# Patient Record
Sex: Female | Born: 1960 | Race: White | Hispanic: No | Marital: Single | Smoking: Current every day smoker
Health system: Southern US, Community
[De-identification: ages and names within clinical notes are randomized; demographics above are authoritative.]

## PROBLEM LIST (undated history)

## (undated) DIAGNOSIS — M1990 Unspecified osteoarthritis, unspecified site: Secondary | ICD-10-CM

## (undated) DIAGNOSIS — A6920 Lyme disease, unspecified: Secondary | ICD-10-CM

## (undated) DIAGNOSIS — E119 Type 2 diabetes mellitus without complications: Secondary | ICD-10-CM

## (undated) DIAGNOSIS — F32A Depression, unspecified: Secondary | ICD-10-CM

## (undated) DIAGNOSIS — F329 Major depressive disorder, single episode, unspecified: Secondary | ICD-10-CM

## (undated) DIAGNOSIS — I10 Essential (primary) hypertension: Secondary | ICD-10-CM

## (undated) DIAGNOSIS — F419 Anxiety disorder, unspecified: Secondary | ICD-10-CM

## (undated) HISTORY — PX: CHOLECYSTECTOMY: SHX55

## (undated) HISTORY — PX: ARTHROSCOPIC REPAIR ACL: SUR80

---

## 2016-01-12 ENCOUNTER — Emergency Department (HOSPITAL_BASED_OUTPATIENT_CLINIC_OR_DEPARTMENT_OTHER): Payer: Medicaid Other

## 2016-01-12 ENCOUNTER — Emergency Department (HOSPITAL_BASED_OUTPATIENT_CLINIC_OR_DEPARTMENT_OTHER)
Admission: EM | Admit: 2016-01-12 | Discharge: 2016-01-13 | Disposition: A | Discharge status: Home / Self Care | Payer: Medicaid Other | Attending: Emergency Medicine | Admitting: Emergency Medicine

## 2016-01-12 ENCOUNTER — Encounter (HOSPITAL_BASED_OUTPATIENT_CLINIC_OR_DEPARTMENT_OTHER): Payer: Self-pay | Admitting: Emergency Medicine

## 2016-01-12 DIAGNOSIS — F17200 Nicotine dependence, unspecified, uncomplicated: Secondary | ICD-10-CM | POA: Insufficient documentation

## 2016-01-12 DIAGNOSIS — Y939 Activity, unspecified: Secondary | ICD-10-CM | POA: Diagnosis not present

## 2016-01-12 DIAGNOSIS — X58XXXA Exposure to other specified factors, initial encounter: Secondary | ICD-10-CM | POA: Diagnosis not present

## 2016-01-12 DIAGNOSIS — Z8659 Personal history of other mental and behavioral disorders: Secondary | ICD-10-CM | POA: Insufficient documentation

## 2016-01-12 DIAGNOSIS — S90121A Contusion of right lesser toe(s) without damage to nail, initial encounter: Secondary | ICD-10-CM | POA: Diagnosis not present

## 2016-01-12 DIAGNOSIS — I10 Essential (primary) hypertension: Secondary | ICD-10-CM | POA: Insufficient documentation

## 2016-01-12 DIAGNOSIS — Y998 Other external cause status: Secondary | ICD-10-CM | POA: Insufficient documentation

## 2016-01-12 DIAGNOSIS — M549 Dorsalgia, unspecified: Secondary | ICD-10-CM | POA: Diagnosis not present

## 2016-01-12 DIAGNOSIS — G8929 Other chronic pain: Secondary | ICD-10-CM | POA: Insufficient documentation

## 2016-01-12 DIAGNOSIS — S99921A Unspecified injury of right foot, initial encounter: Secondary | ICD-10-CM | POA: Diagnosis present

## 2016-01-12 DIAGNOSIS — Y929 Unspecified place or not applicable: Secondary | ICD-10-CM | POA: Diagnosis not present

## 2016-01-12 HISTORY — DX: Anxiety disorder, unspecified: F41.9

## 2016-01-12 HISTORY — DX: Major depressive disorder, single episode, unspecified: F32.9

## 2016-01-12 HISTORY — DX: Essential (primary) hypertension: I10

## 2016-01-12 HISTORY — DX: Depression, unspecified: F32.A

## 2016-01-12 NOTE — ED Notes (Signed)
Pt in c/o toe pain on R foot, denies injury. Pt has chronic pain.

## 2016-01-12 NOTE — ED Notes (Signed)
Discoloration, bruising and pain to middle right toe with unknown injury.

## 2016-01-13 NOTE — Discharge Instructions (Signed)

## 2016-01-13 NOTE — ED Provider Notes (Addendum)
CSN: [id number]     Arrival date & time 01/12/16  2237 History   First MD Initiated Contact with Patient 02/02/2016 0010     Chief Complaint  Patient presents with  . Toe Pain     (Consider location/radiation/quality/duration/timing/severity/associated sxs/prior Treatment) HPI  This is a 55 year old female who noticed yesterday evening that her right middle toe was purple. She does not recall injuring it. There is no associated deformity. She states she has chronic back pain pain and has been on oxycodone and fentanyl, multiple prescriptions in the last year. She is also on diazepam. She is having some pain in that toe with ambulation but not with palpation or movement.  Past Medical History  Diagnosis Date  . Hypertension   . Depression   . Anxiety    Past Surgical History  Procedure Laterality Date  . Arthroscopic repair acl    . Cholecystectomy     History reviewed. No pertinent family history. Social History  Substance Use Topics  . Smoking status: Current Every Day Smoker  . Smokeless tobacco: None  . Alcohol Use: No   OB History    No data available     Review of Systems  All other systems reviewed and are negative.   Allergies  Review of patient's allergies indicates no known allergies.  Home Medications   Prior to Admission medications   Not on File but on diazepam and Percocet per [place] Controlled Substance Reporting System.   BP 135/73 mmHg  Pulse 97  Temp(Src) 98.6 F (37 C) (Oral)  Resp 18  Ht  (1.676 m)  Wt 160 lb (72.576 kg)  BMI 25.84 kg/m2  SpO2 95%   Physical Exam  General: Well-developed, well-nourished female in no acute distress; appearance consistent with age of record HENT: normocephalic; atraumatic Eyes: pupils equal, round and reactive to light; extraocular muscles intact Neck: supple Heart: regular rate and rhythm Lungs: clear to auscultation bilaterally Abdomen: soft; nondistended; nontender; bowel sounds present Extremities:  No deformity; full range of motion; pulses normal; subacute appearing ecchymosis of the right middle toe without tenderness on passive range of motion, sensation intact distally with brisk capillary refill:  Neurologic: Awake, alert; dysarthria; motor function intact in all extremities and symmetric; no facial droop Skin: Warm and dry Psychiatric: Normal mood and affect    ED Course  Procedures (including critical care time)   MDM  Nursing notes and vitals signs, including pulse oximetry, reviewed.  Summary of this visit's results, reviewed by myself:  Imaging Studies: Dg Foot Complete Right  2016-02-02  CLINICAL DATA:  Right foot third toe pain and bruising. No known injury. EXAM: RIGHT FOOT COMPLETE - 3+ VIEW COMPARISON:  None. FINDINGS: There is no evidence of fracture or dislocation, with special attention to the third toe. There is no evidence of arthropathy. Plantar calcaneal spur is noted. There is an os peroneal. Soft tissues are unremarkable. IMPRESSION: No acute bony abnormality. Plantar calcaneal spur. Electronically Signed   By: Rubye Oaks M.D.   On: 2016-02-02 00:06   On examination the patient's toe appears to have a subacute injury. The ecchymosis appears several days old. There is no associated fracture. We will provide a postop shoe. Narcotic analgesics are not indicated especially in view of the patient's history of chronic opioid and diazepam use.     Paula Libra, MD 02/02/16 4034  Paula Libra, MD 02-02-16 [id number]

## 2016-01-13 NOTE — ED Notes (Signed)
Pt verbalizes understanding of d/c instructions and denies any further needs at this time. 

## 2016-02-11 ENCOUNTER — Encounter (HOSPITAL_BASED_OUTPATIENT_CLINIC_OR_DEPARTMENT_OTHER): Payer: Self-pay | Admitting: *Deleted

## 2016-02-11 ENCOUNTER — Emergency Department (HOSPITAL_BASED_OUTPATIENT_CLINIC_OR_DEPARTMENT_OTHER): Payer: Medicaid Other

## 2016-02-11 ENCOUNTER — Emergency Department (HOSPITAL_BASED_OUTPATIENT_CLINIC_OR_DEPARTMENT_OTHER)
Admission: EM | Admit: 2016-02-11 | Discharge: 2016-02-11 | Disposition: A | Discharge status: Home / Self Care | Payer: Medicaid Other | Attending: Emergency Medicine | Admitting: Emergency Medicine

## 2016-02-11 DIAGNOSIS — F17210 Nicotine dependence, cigarettes, uncomplicated: Secondary | ICD-10-CM | POA: Diagnosis not present

## 2016-02-11 DIAGNOSIS — Y9289 Other specified places as the place of occurrence of the external cause: Secondary | ICD-10-CM | POA: Insufficient documentation

## 2016-02-11 DIAGNOSIS — S8991XA Unspecified injury of right lower leg, initial encounter: Secondary | ICD-10-CM | POA: Diagnosis present

## 2016-02-11 DIAGNOSIS — E119 Type 2 diabetes mellitus without complications: Secondary | ICD-10-CM | POA: Diagnosis not present

## 2016-02-11 DIAGNOSIS — I10 Essential (primary) hypertension: Secondary | ICD-10-CM | POA: Diagnosis not present

## 2016-02-11 DIAGNOSIS — Y9389 Activity, other specified: Secondary | ICD-10-CM | POA: Insufficient documentation

## 2016-02-11 DIAGNOSIS — Y998 Other external cause status: Secondary | ICD-10-CM | POA: Insufficient documentation

## 2016-02-11 DIAGNOSIS — F419 Anxiety disorder, unspecified: Secondary | ICD-10-CM | POA: Insufficient documentation

## 2016-02-11 DIAGNOSIS — G8929 Other chronic pain: Secondary | ICD-10-CM | POA: Insufficient documentation

## 2016-02-11 DIAGNOSIS — Z8619 Personal history of other infectious and parasitic diseases: Secondary | ICD-10-CM | POA: Insufficient documentation

## 2016-02-11 DIAGNOSIS — F329 Major depressive disorder, single episode, unspecified: Secondary | ICD-10-CM | POA: Diagnosis not present

## 2016-02-11 DIAGNOSIS — M1990 Unspecified osteoarthritis, unspecified site: Secondary | ICD-10-CM | POA: Diagnosis not present

## 2016-02-11 DIAGNOSIS — X58XXXA Exposure to other specified factors, initial encounter: Secondary | ICD-10-CM | POA: Diagnosis not present

## 2016-02-11 DIAGNOSIS — S81801A Unspecified open wound, right lower leg, initial encounter: Secondary | ICD-10-CM | POA: Insufficient documentation

## 2016-02-11 DIAGNOSIS — Z7984 Long term (current) use of oral hypoglycemic drugs: Secondary | ICD-10-CM | POA: Diagnosis not present

## 2016-02-11 DIAGNOSIS — M25561 Pain in right knee: Secondary | ICD-10-CM

## 2016-02-11 HISTORY — DX: Unspecified osteoarthritis, unspecified site: M19.90

## 2016-02-11 HISTORY — DX: Type 2 diabetes mellitus without complications: E11.9

## 2016-02-11 HISTORY — DX: Lyme disease, unspecified: A69.20

## 2016-02-11 LAB — BASIC METABOLIC PANEL
Anion gap: 12 (ref 5–15)
BUN: 28 mg/dL — AB (ref 6–20)
CHLORIDE: 104 mmol/L (ref 101–111)
CO2: 22 mmol/L (ref 22–32)
CREATININE: 0.79 mg/dL (ref 0.44–1.00)
Calcium: 9.1 mg/dL (ref 8.9–10.3)
Glucose, Bld: 80 mg/dL (ref 65–99)
POTASSIUM: 3.2 mmol/L — AB (ref 3.5–5.1)
SODIUM: 138 mmol/L (ref 135–145)

## 2016-02-11 LAB — CBC WITH DIFFERENTIAL/PLATELET
BASOS ABS: 0.1 10*3/uL (ref 0.0–0.1)
Basophils Relative: 1 %
EOS ABS: 0.4 10*3/uL (ref 0.0–0.7)
EOS PCT: 4 %
HCT: 38.9 % (ref 36.0–46.0)
HEMOGLOBIN: 13.5 g/dL (ref 12.0–15.0)
LYMPHS PCT: 23 %
Lymphs Abs: 2.5 10*3/uL (ref 0.7–4.0)
MCH: 33.9 pg (ref 26.0–34.0)
MCHC: 34.7 g/dL (ref 30.0–36.0)
MCV: 97.7 fL (ref 78.0–100.0)
Monocytes Absolute: 0.8 10*3/uL (ref 0.1–1.0)
Monocytes Relative: 7 %
NEUTROS PCT: 65 %
Neutro Abs: 7.1 10*3/uL (ref 1.7–7.7)
PLATELETS: 252 10*3/uL (ref 150–400)
RBC: 3.98 MIL/uL (ref 3.87–5.11)
RDW: 13.8 % (ref 11.5–15.5)
WBC: 10.9 10*3/uL — AB (ref 4.0–10.5)

## 2016-02-11 MED ORDER — HYDROCODONE-ACETAMINOPHEN 5-325 MG PO TABS: 1.0000 | ORAL_TABLET | Freq: Four times a day (QID) | ORAL | Status: AC | PRN

## 2016-02-11 MED ORDER — NAPROXEN 500 MG PO TABS: 500.0000 mg | ORAL_TABLET | Freq: Two times a day (BID) | ORAL | Status: AC

## 2016-02-11 NOTE — ED Notes (Signed)
Pt placed on auto vitals Q30.  

## 2016-02-11 NOTE — ED Notes (Signed)
MD at bedside. 

## 2016-02-11 NOTE — ED Notes (Signed)
Reports she was seen in ED for right toe pain recently. Pain in now in leg and knee. Denies new injury. Hx of RA

## 2016-02-11 NOTE — Discharge Instructions (Signed)
°  Knee immobilizer for comfort. Take Naprosyn on a regular basis. Supplement with hydrocodone as needed for additional pain relief. X-rays were negative other than showing some evidence of arthritic changes in the right knee. No evidence of any blood clots in the leg.   Follow-up with sports medicine referral information provided.

## 2016-02-11 NOTE — ED Provider Notes (Signed)
CSN: [id number]     Arrival date & time 02/11/16  1704 History  By signing my name below, I, Gonzella Lex, attest that this documentation has been prepared under the direction and in the presence of Vanetta Mulders, MD. Electronically Signed: Gonzella Lex, Scribe. 02/11/2016. 6:56 PM.   Chief Complaint  Patient presents with  . Knee Pain   Patient is a 56 y.o. female presenting with knee pain. The history is provided by the patient. No language interpreter was used.  Knee Pain Location:  Knee and leg Time since incident:  1 day Injury: no   Leg location:  R leg Knee location:  R knee Pain details:    Quality:  Unable to specify   Radiates to:  R leg   Severity:  Mild   Onset quality:  Sudden   Duration:  1 day   Timing:  Constant   Progression:  Unchanged Chronicity:  New Dislocation: no   Foreign body present:  No foreign bodies Prior injury to area:  No Relieved by:  Nothing Worsened by:  Nothing tried Ineffective treatments:  None tried Associated symptoms: back pain, numbness and swelling   Associated symptoms: no fever   HPI Comments: Megan Day is a 55 y.o. female with a hx of arthritis, chronic back pain, DM, and lyme disease and a surgical hx of arthroscopic ACL repair, who presents to the Emergency Department complaining of sudden onset, constant, 10/10 pain to her right lower extremity, right knee, right shin, and right toe which began earlier today. She also reports associated tightness along her right shin as well as pain with ambulation and weight bearing secondary to right knee and right leg pain. She also reports mild leg swelling as well as mild numbness to her RLE. Pt was also recently seen in the ED, about one month ago, for right toe pain. She denies any recent injury or fall and has not taken any pain medication today. Pt denies fever, chills, rhinorrhea, sore throat, visual disturbance, HA, cough, SOB, CP, nausea, vomiting, abd pain, diarrhea,  dysuria, hematuria, and use of anticoagulants.   Past Medical History  Diagnosis Date  . Hypertension   . Depression   . Anxiety   . Arthritis   . Lyme disease   . Diabetes mellitus without complication [facility])    Past Surgical History  Procedure Laterality Date  . Arthroscopic repair acl    . Cholecystectomy     No family history on file. Social History  Substance Use Topics  . Smoking status: Current Every Day Smoker    Types: Cigarettes  . Smokeless tobacco: Never Used  . Alcohol Use: No   OB History    No data available     Review of Systems  Constitutional: Negative for fever and chills.  HENT: Negative for rhinorrhea and sore throat.   Eyes: Negative for visual disturbance.  Respiratory: Negative for cough and shortness of breath.   Cardiovascular: Positive for leg swelling. Negative for chest pain.  Gastrointestinal: Negative for nausea, vomiting, abdominal pain and diarrhea.  Genitourinary: Negative for dysuria and hematuria.  Musculoskeletal: Positive for myalgias, back pain and arthralgias.  Skin: Positive for rash ( back of right leg).  Neurological: Positive for numbness.  Hematological: Bruises/bleeds easily.  Psychiatric/Behavioral: Negative for confusion.  All other systems reviewed and are negative.  Allergies  Review of patient's allergies indicates no known allergies.  Home Medications   Prior to Admission medications   Medication Sig Start Date  End Date Taking? Authorizing Provider  Amitriptyline HCl (ELAVIL PO) Take by mouth.   Yes Historical Provider, MD  diazepam (VALIUM) 10 MG tablet Take 10 mg by mouth every 12 (twelve) hours as needed for anxiety.   Yes Historical Provider, MD  GLIPIZIDE PO Take by mouth.   Yes Historical Provider, MD  METFORMIN HCL PO Take by mouth.   Yes Historical Provider, MD  oxyCODONE-acetaminophen (PERCOCET) 10-325 MG tablet Take 1 tablet by mouth every 6 (six) hours as needed for pain.   Yes Historical Provider, MD   PARoxetine HCl (PAXIL PO) Take by mouth.   Yes Historical Provider, MD  HYDROcodone-acetaminophen (NORCO/VICODIN) 5-325 MG tablet Take 1-2 tablets by mouth every 6 (six) hours as needed for moderate pain. 02/11/16   Vanetta Mulders, MD  naproxen (NAPROSYN) 500 MG tablet Take 1 tablet (500 mg total) by mouth 2 (two) times daily. 02/11/16   Vanetta Mulders, MD   BP 129/68 mmHg  Pulse 92  Temp(Src) 98.8 F (37.1 C) (Oral)  Resp 18  Ht  (1.676 m)  Wt 72.576 kg  BMI 25.84 kg/m2  SpO2 97% Physical Exam  Constitutional: She is oriented to person, place, and time. She appears well-developed and well-nourished. No distress.  HENT:  Head: Normocephalic.  Mouth/Throat: Oropharynx is clear and moist.  Eyes: Conjunctivae are normal. Pupils are equal, round, and reactive to light. No scleral icterus.  Cardiovascular: Normal rate, regular rhythm and normal heart sounds.   Pulmonary/Chest: Effort normal and breath sounds normal.  Lungs clear bilaterally  Abdominal: Soft. Bowel sounds are normal. She exhibits no distension. There is no tenderness.  Musculoskeletal: She exhibits no edema.  No swelling in the ankles Cap refill in both big toes is 1 second Dorsalis pedis pulse 1+ bilaterally  Increased warmth in the right knee with slight edema  No real effusion  Right calf is tight Back of heel of right leg areas where she scratched has some open wounds but no secondary infection Faint redness to third toe of right foot  Neurological: She is alert and oriented to person, place, and time. No cranial nerve deficit. She exhibits normal muscle tone. Coordination normal.  Skin: Skin is warm and dry.  Psychiatric: She has a normal mood and affect.  Nursing note and vitals reviewed.   ED Course  Procedures  DIAGNOSTIC STUDIES:    Oxygen Saturation is 99% on RA, normal by my interpretation.   COORDINATION OF CARE:  6:18 PM Will order x-rays of knee and leg. Will order lab work and ultra sound  study. Discussed treatment plan with pt at bedside and pt agreed to plan.   Results for orders placed or performed during the hospital encounter of 02/11/16  Basic metabolic panel  Result Value Ref Range   Sodium 138 135 - 145 mmol/L   Potassium 3.2 (L) 3.5 - 5.1 mmol/L   Chloride 104 101 - 111 mmol/L   CO2 22 22 - 32 mmol/L   Glucose, Bld 80 65 - 99 mg/dL   BUN 28 (H) 6 - 20 mg/dL   Creatinine, Ser 1.61 0.44 - 1.00 mg/dL   Calcium 9.1 8.9 - 09.6 mg/dL   GFR calc non Af Amer >60 >60 mL/min   GFR calc Af Amer >60 >60 mL/min   Anion gap 12 5 - 15  CBC with Differential/Platelet  Result Value Ref Range   WBC 10.9 (H) 4.0 - 10.5 K/uL   RBC 3.98 3.87 - 5.11 MIL/uL   Hemoglobin  13.5 12.0 - 15.0 g/dL   HCT 54.0 98.1 - 19.1 %   MCV 97.7 78.0 - 100.0 fL   MCH 33.9 26.0 - 34.0 pg   MCHC 34.7 30.0 - 36.0 g/dL   RDW 47.8 29.5 - 62.1 %   Platelets 252 150 - 400 K/uL   Neutrophils Relative % 65 %   Neutro Abs 7.1 1.7 - 7.7 K/uL   Lymphocytes Relative 23 %   Lymphs Abs 2.5 0.7 - 4.0 K/uL   Monocytes Relative 7 %   Monocytes Absolute 0.8 0.1 - 1.0 K/uL   Eosinophils Relative 4 %   Eosinophils Absolute 0.4 0.0 - 0.7 K/uL   Basophils Relative 1 %   Basophils Absolute 0.1 0.0 - 0.1 K/uL   Dg Tibia/fibula Right  02/11/2016  CLINICAL DATA:  Sudden onset of right lower extremity pain, initial encounter, no known injury EXAM: RIGHT TIBIA AND FIBULA - 2 VIEW COMPARISON:  None. FINDINGS: Degenerative changes of the right knee joint are noted. No acute fracture or dislocation is seen. Small joint effusion is noted in the knee joint. No other soft tissue abnormality is seen. IMPRESSION: Degenerative change with small joint effusion in the knee joint. Electronically Signed   By: Alcide Clever M.D.   On: 02/11/2016 20:19   US Venous Img Lower Unilateral Right  02/11/2016  CLINICAL DATA:  Sudden onset right lower extremity pain EXAM: Right LOWER EXTREMITY VENOUS DOPPLER ULTRASOUND TECHNIQUE: Gray-scale  sonography with graded compression, as well as color Doppler and duplex ultrasound were performed to evaluate the lower extremity deep venous systems from the level of the common femoral vein and including the common femoral, femoral, profunda femoral, popliteal and calf veins including the posterior tibial, peroneal and gastrocnemius veins when visible. The superficial great saphenous vein was also interrogated. Spectral Doppler was utilized to evaluate flow at rest and with distal augmentation maneuvers in the common femoral, femoral and popliteal veins. COMPARISON:  None. FINDINGS: Contralateral Common Femoral Vein: Respiratory phasicity is normal and symmetric with the symptomatic side. No evidence of thrombus. Normal compressibility. Common Femoral Vein: No evidence of thrombus. Normal compressibility, respiratory phasicity and response to augmentation. Saphenofemoral Junction: No evidence of thrombus. Normal compressibility and flow on color Doppler imaging. Profunda Femoral Vein: No evidence of thrombus. Normal compressibility and flow on color Doppler imaging. Femoral Vein: No evidence of thrombus. Normal compressibility, respiratory phasicity and response to augmentation. Popliteal Vein: No evidence of thrombus. Normal compressibility, respiratory phasicity and response to augmentation. Calf Veins: No evidence of thrombus. Normal compressibility and flow on color Doppler imaging. Superficial Great Saphenous Vein: No evidence of thrombus. Normal compressibility and flow on color Doppler imaging. Venous Reflux:  None. Other Findings:  Subcutaneous edema is noted about the ankle. IMPRESSION: No evidence of deep venous thrombosis. Electronically Signed   By: Alcide Clever M.D.   On: 02/11/2016 20:21   Dg Knee Complete 4 Views Right  02/11/2016  CLINICAL DATA:  Sudden onset of right lower extremity pain starting 1 week ago, pain and swelling EXAM: RIGHT KNEE - COMPLETE 4+ VIEW COMPARISON:  None. FINDINGS: Four  views of the right knee submitted. No acute fracture or subluxation. There is narrowing of medial joint compartment. Spurring of femoral condyles. Spurring of medial and lateral tibial plateau. Narrowing of patellofemoral joint space. Spurring of patella. Moderate joint effusion. IMPRESSION: No acute fracture or subluxation. Osteoarthritic changes as described above. Electronically Signed   By: Natasha Mead M.D.   On: 02/11/2016 20:20  Dg Foot Complete Right  01/13/2016  CLINICAL DATA:  Right foot third toe pain and bruising. No known injury. EXAM: RIGHT FOOT COMPLETE - 3+ VIEW COMPARISON:  None. FINDINGS: There is no evidence of fracture or dislocation, with special attention to the third toe. There is no evidence of arthropathy. Plantar calcaneal spur is noted. There is an os peroneal. Soft tissues are unremarkable. IMPRESSION: No acute bony abnormality. Plantar calcaneal spur. Electronically Signed   By: Rubye Oaks M.D.   On: 01/13/2016 00:06    Medications - No data to display  I have personally reviewed and evaluated these images and lab results as part of my medical decision-making.  MDM   Final diagnoses:  Knee pain, acute, right    Doppler studies of the right leg without evidence of a deep vein thrombosis. Evidence of arthritic changes in the right knee with a little bit of an effusion. Most likely this is due to arthritic changes and that's the cause of the pain. Will treat symptomatically with knee immobilizer and referred to sports medicine for follow-up. No concern for any vascular compromise to the right leg.  I personally performed the services described in this documentation, which was scribed in my presence. The recorded information has been reviewed and is accurate.      Vanetta Mulders, MD 02/11/16 2135

## 2017-03-04 IMAGING — DX DG TIBIA/FIBULA 2V*R*
4 series · 4 of 4 positions shown · non-contrast
Comparison: None.

CLINICAL DATA: Sudden onset of right lower extremity pain, initial
encounter, no known injury

EXAM:
RIGHT TIBIA AND FIBULA - 2 VIEW

[tibia ap (1 of 2)]
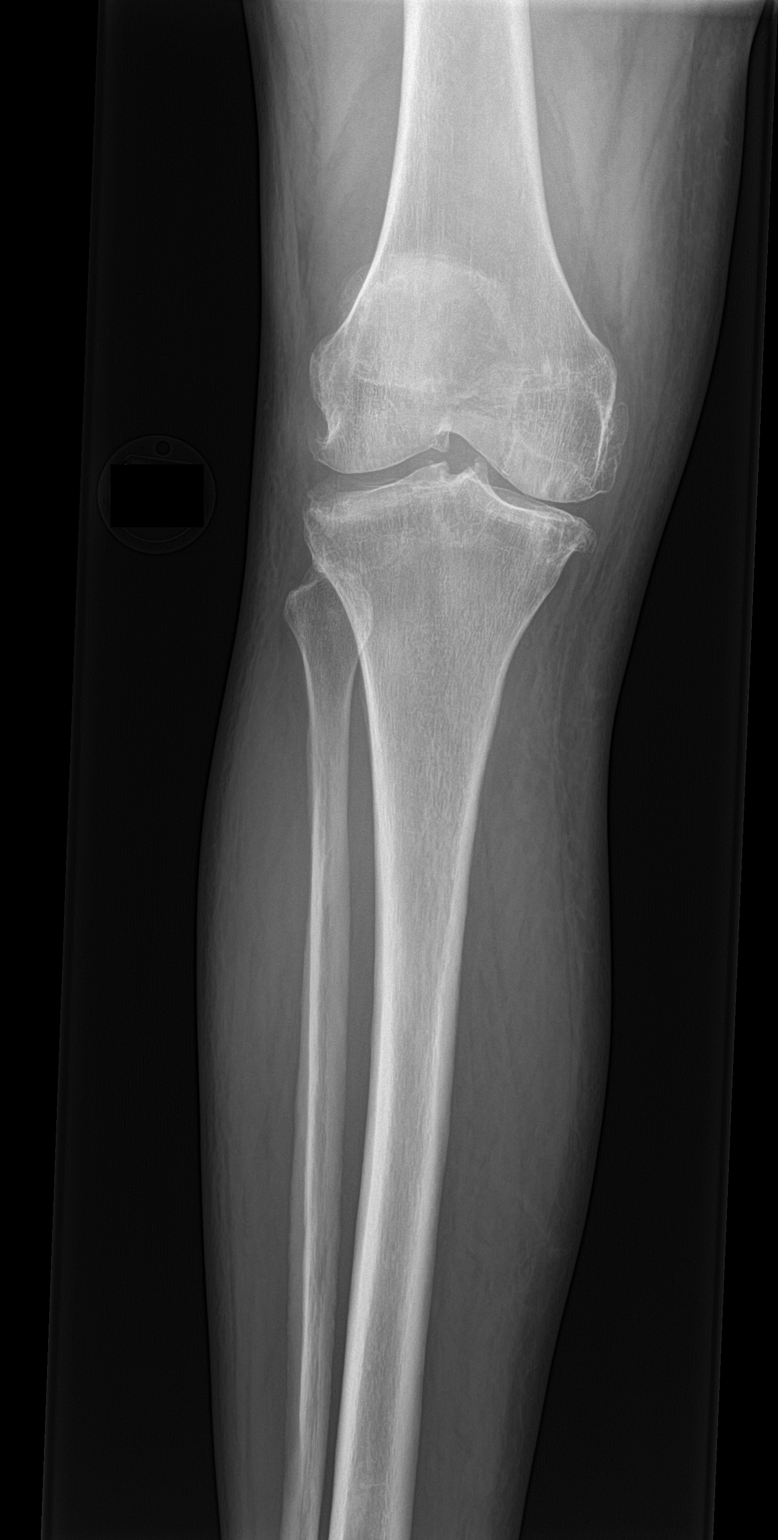

[tibia ap (2 of 2)]
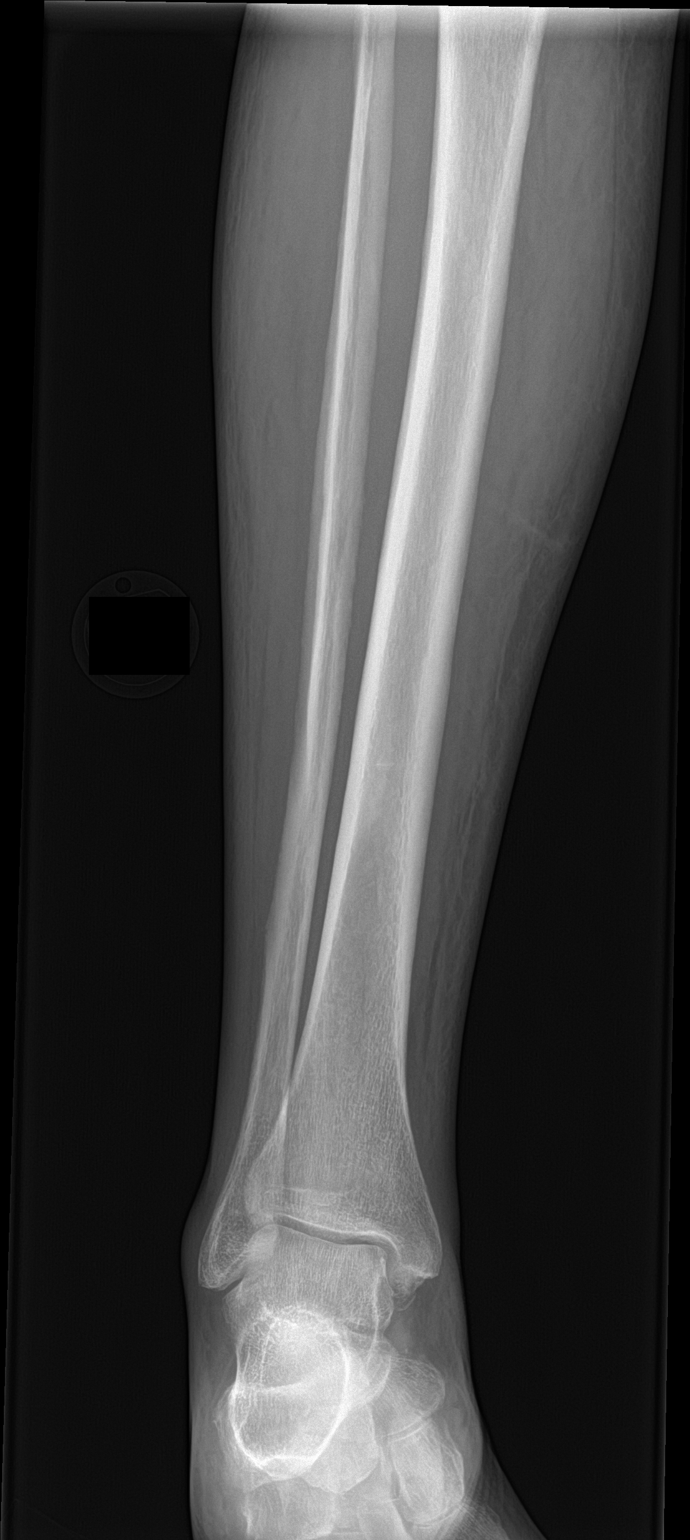

[tibia lat (1 of 2)]
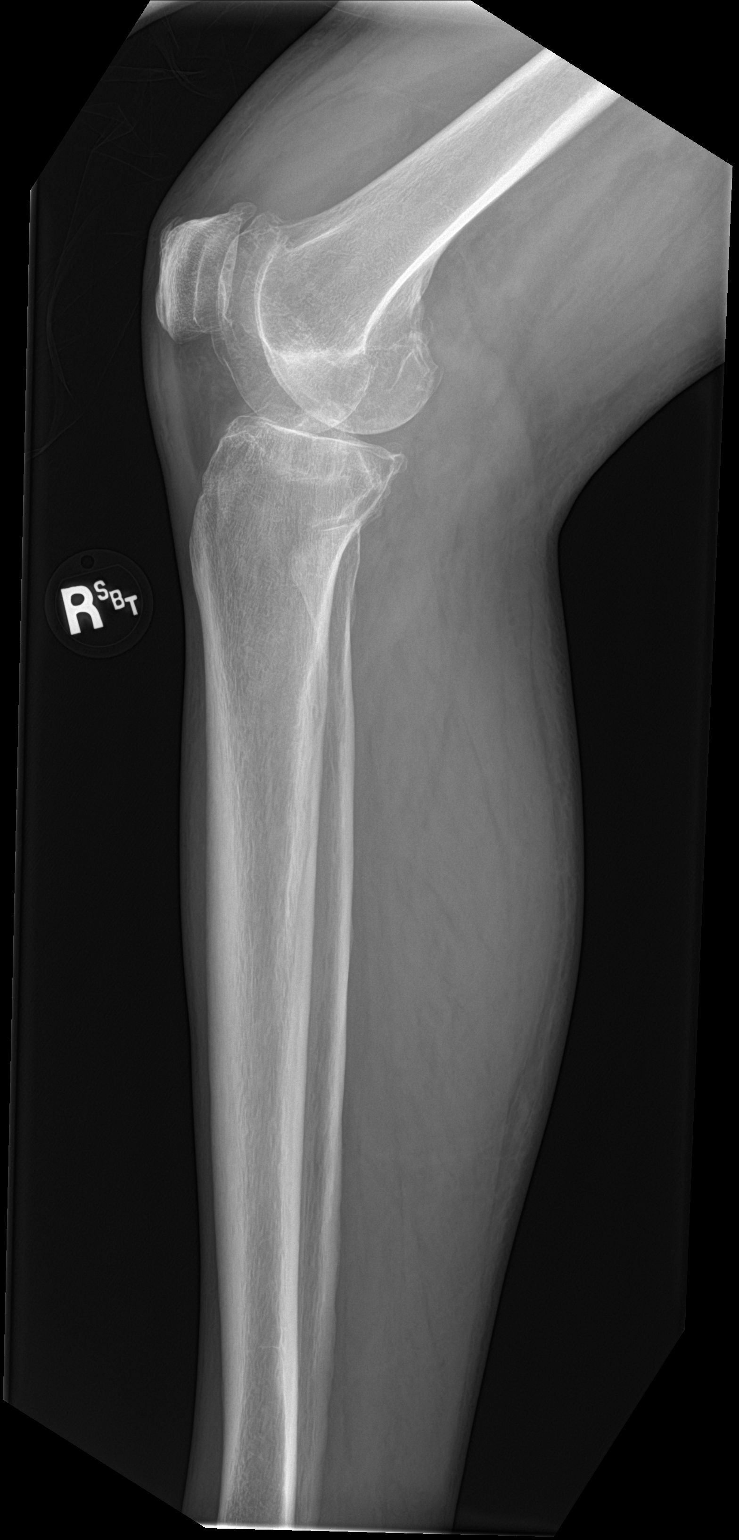

[tibia lat (2 of 2)]
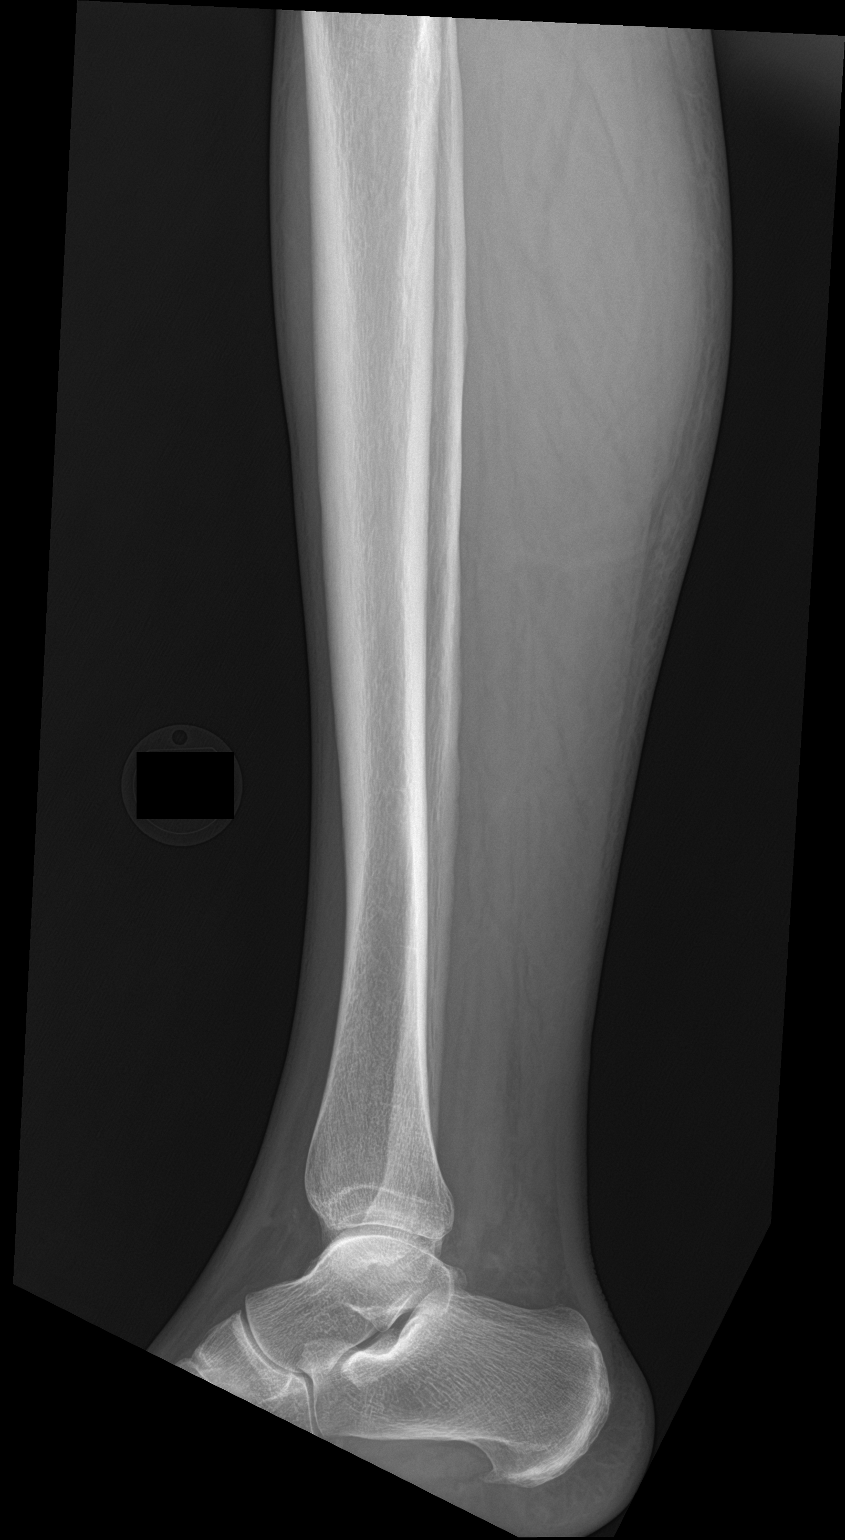

[4 of 4 positions shown; findings below may reference images not displayed]

FINDINGS: Degenerative changes of the right knee joint are noted. No acute
fracture or dislocation is seen. Small joint effusion is noted in
the knee joint. No other soft tissue abnormality is seen.
IMPRESSION: Degenerative change with small joint effusion in the knee joint.

## 2017-11-26 IMAGING — US US EXTREM LOW VENOUS*R*
1 series · 13 of 24 positions shown · non-contrast
Comparison: None.

CLINICAL DATA: Sudden onset right lower extremity pain



[Series 1: us extrem low venous*right* · 0.08mm/px · 13 of 27 slices shown]
[im 1/27]
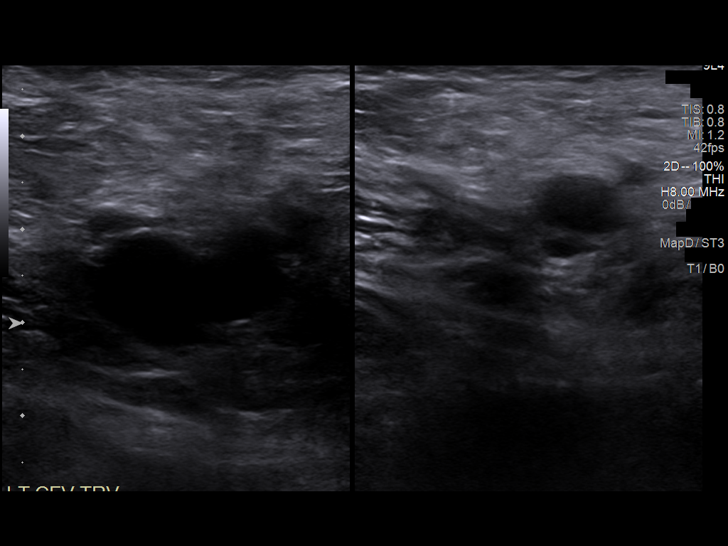
[im 3/27]
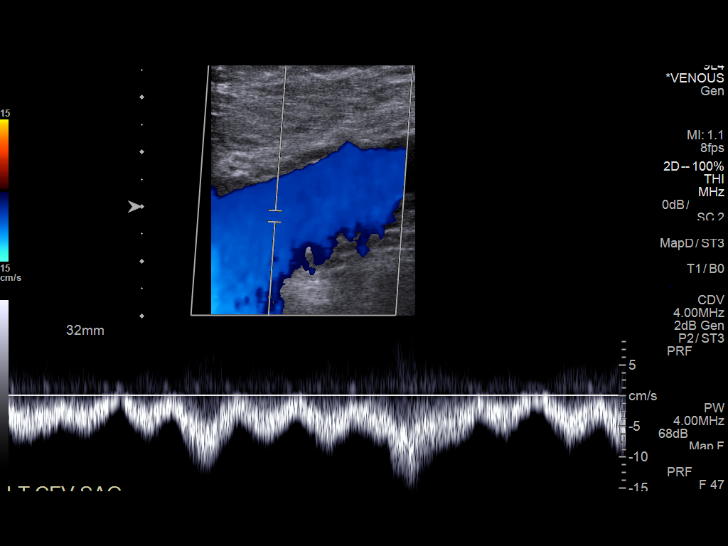
[im 5/27]
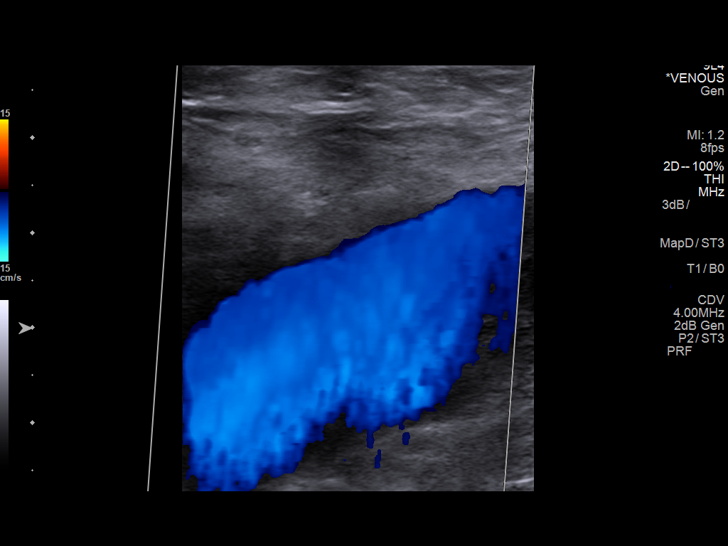
[im 7/27]
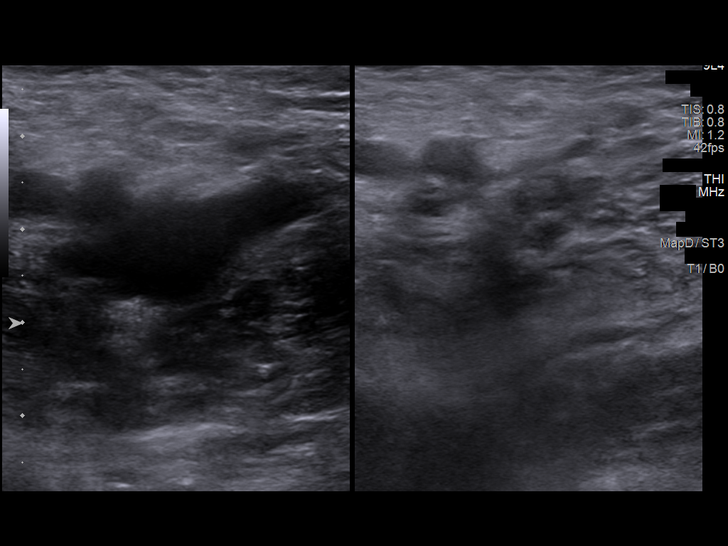
[im 10/27]
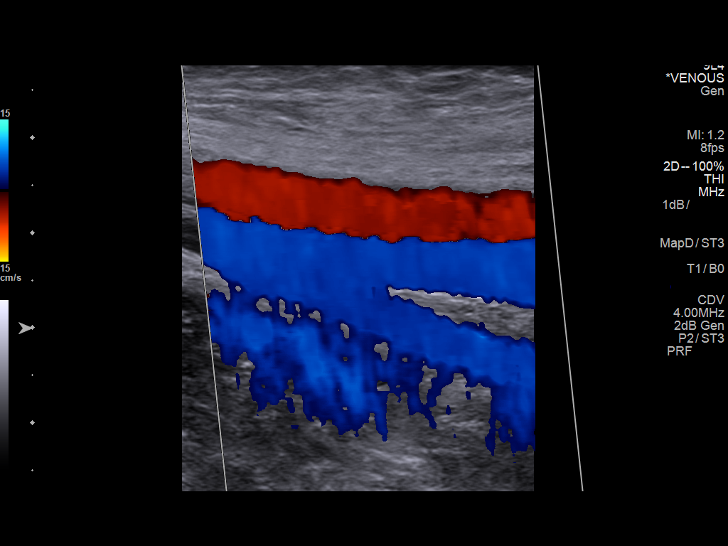
[im 12/27]
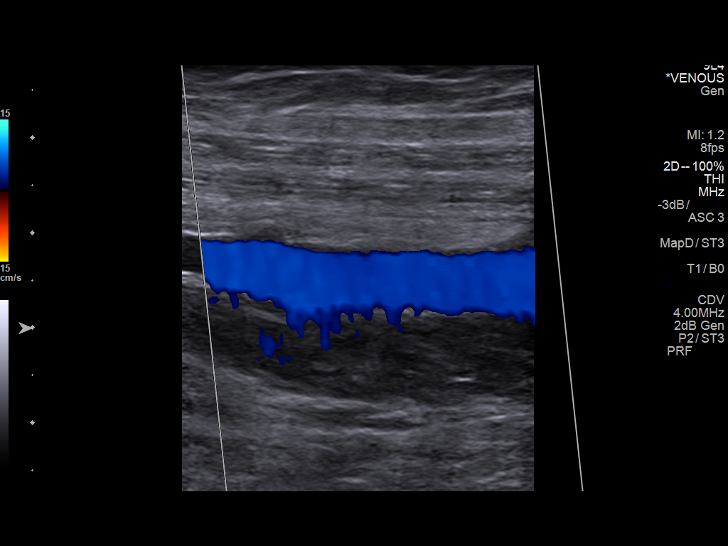
[im 14/27]
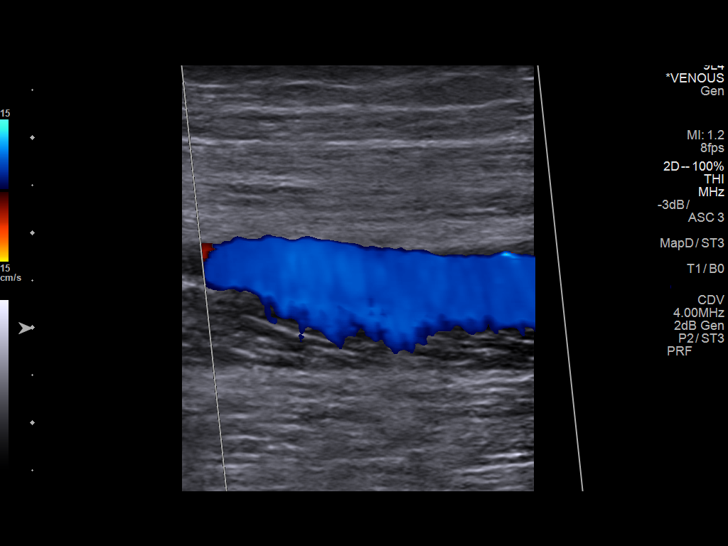
[im 15/27]
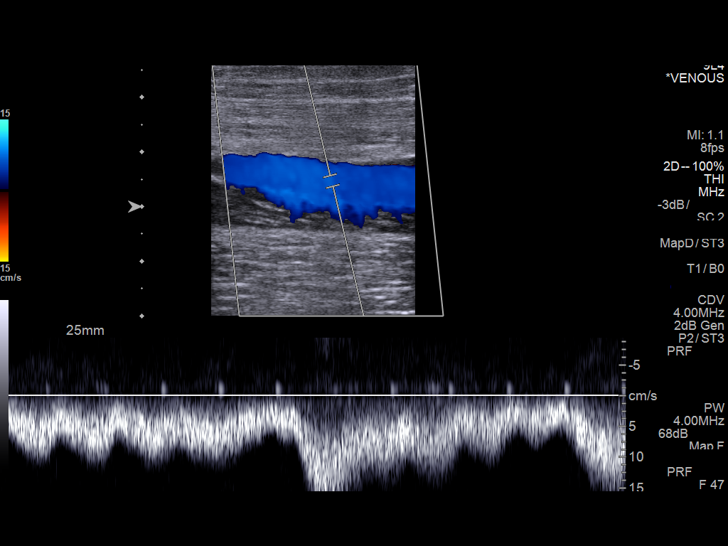
[im 17/27]
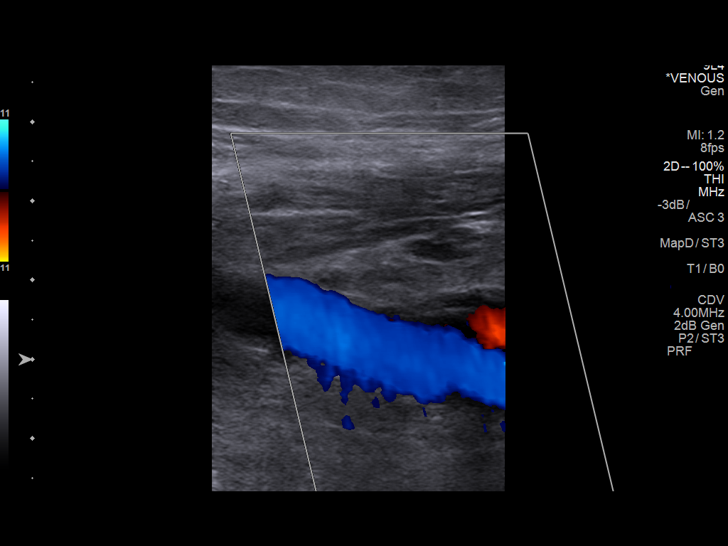
[im 20/27]
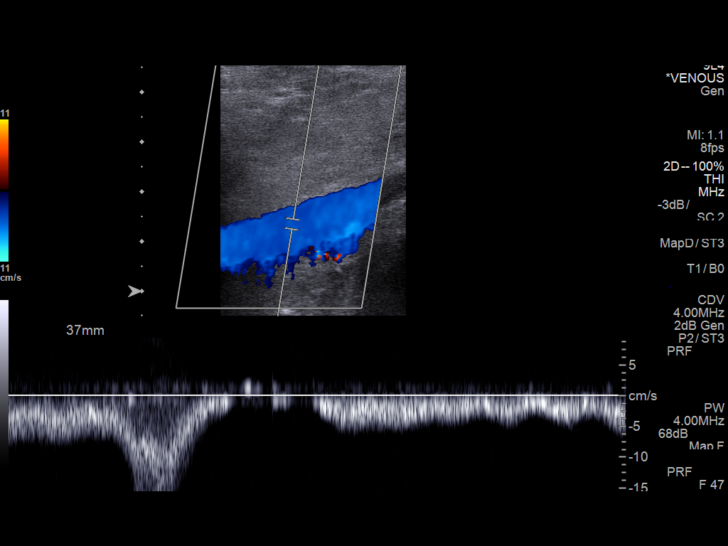
[im 22/27]
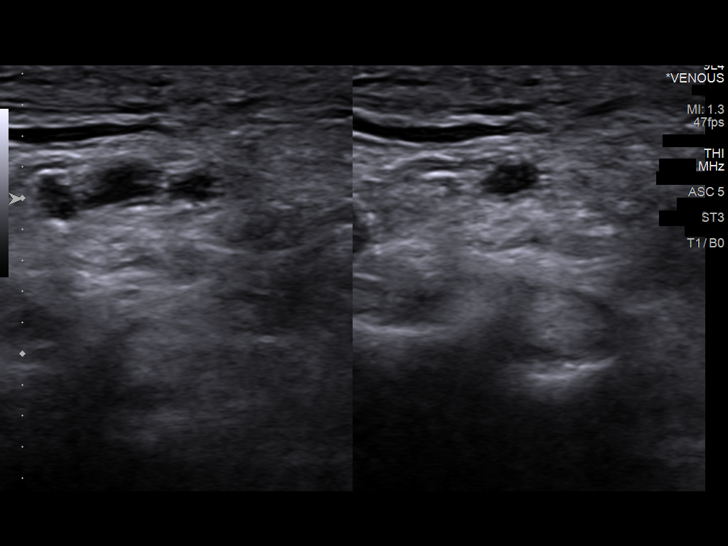
[im 24/27]
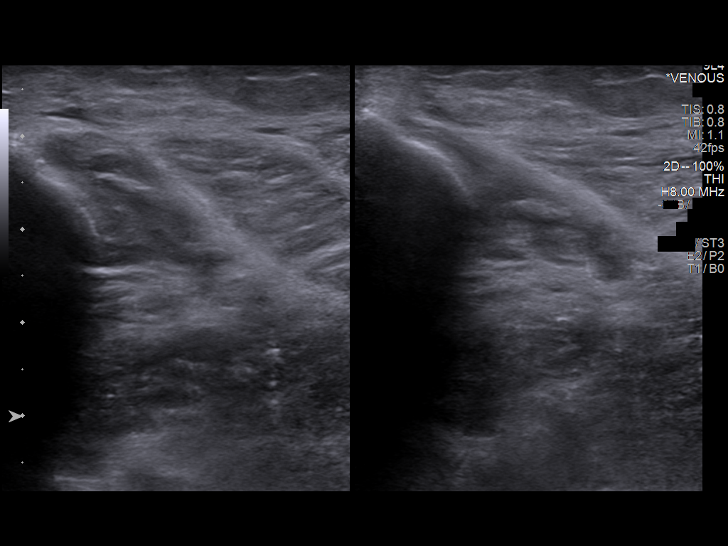
[im 27/27]
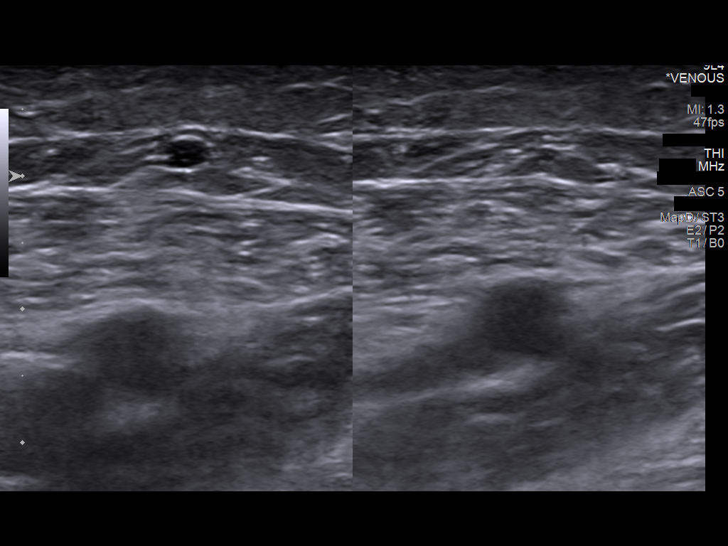

[13 of 24 positions shown; findings below may reference images not displayed]

FINDINGS: Contralateral Common Femoral Vein: Respiratory phasicity is normal
and symmetric with the symptomatic side. No evidence of thrombus.
Normal compressibility.

Common Femoral Vein: No evidence of thrombus. Normal
compressibility, respiratory phasicity and response to augmentation.

Saphenofemoral Junction: No evidence of thrombus. Normal
compressibility and flow on color Doppler imaging.

Profunda Femoral Vein: No evidence of thrombus. Normal
compressibility and flow on color Doppler imaging.

Femoral Vein: No evidence of thrombus. Normal compressibility,
respiratory phasicity and response to augmentation.

Popliteal Vein: No evidence of thrombus. Normal compressibility,
respiratory phasicity and response to augmentation.

Calf Veins: No evidence of thrombus. Normal compressibility and flow
on color Doppler imaging.

Superficial Great Saphenous Vein: No evidence of thrombus. Normal
compressibility and flow on color Doppler imaging.

Venous Reflux:  None.

Other Findings:  Subcutaneous edema is noted about the ankle.
IMPRESSION: No evidence of deep venous thrombosis.
